# Patient Record
Sex: Male | Born: 1955 | Race: White | Hispanic: No | Marital: Married
Health system: Southern US, Community
[De-identification: ages and names within clinical notes are randomized; demographics above are authoritative.]

---

## 2017-07-11 ENCOUNTER — Inpatient Hospital Stay
Admission: AD | Admit: 2017-07-11 | Discharge: 2017-08-05 | Disposition: A | Discharge status: Home / Self Care | Payer: Self-pay | Source: Other Acute Inpatient Hospital | Attending: Internal Medicine | Admitting: Internal Medicine

## 2017-07-11 DIAGNOSIS — T85520A Displacement of bile duct prosthesis, initial encounter: Secondary | ICD-10-CM

## 2017-07-11 DIAGNOSIS — L0291 Cutaneous abscess, unspecified: Secondary | ICD-10-CM

## 2017-07-12 LAB — CBC WITH DIFFERENTIAL/PLATELET
BASOS ABS: 0 10*3/uL (ref 0.0–0.1)
BASOS PCT: 1 %
EOS PCT: 6 %
Eosinophils Absolute: 0.2 10*3/uL (ref 0.0–0.7)
HEMATOCRIT: 28.1 % — AB (ref 39.0–52.0)
HEMOGLOBIN: 8.9 g/dL — AB (ref 13.0–17.0)
Lymphocytes Relative: 26 %
Lymphs Abs: 1 10*3/uL (ref 0.7–4.0)
MCH: 28 pg (ref 26.0–34.0)
MCHC: 31.7 g/dL (ref 30.0–36.0)
MCV: 88.4 fL (ref 78.0–100.0)
Monocytes Absolute: 0.4 10*3/uL (ref 0.1–1.0)
Monocytes Relative: 11 %
Neutro Abs: 2.2 10*3/uL (ref 1.7–7.7)
Neutrophils Relative %: 56 %
Platelets: 198 10*3/uL (ref 150–400)
RBC: 3.18 MIL/uL — AB (ref 4.22–5.81)
RDW: 19.3 % — AB (ref 11.5–15.5)
WBC: 3.9 10*3/uL — AB (ref 4.0–10.5)

## 2017-07-12 LAB — COMPREHENSIVE METABOLIC PANEL
ALBUMIN: 1.1 g/dL — AB (ref 3.5–5.0)
ALT: 38 U/L (ref 17–63)
AST: 37 U/L (ref 15–41)
Alkaline Phosphatase: 214 U/L — ABNORMAL HIGH (ref 38–126)
Anion gap: 3 — ABNORMAL LOW (ref 5–15)
BILIRUBIN TOTAL: 0.3 mg/dL (ref 0.3–1.2)
BUN: 7 mg/dL (ref 6–20)
CHLORIDE: 101 mmol/L (ref 101–111)
CO2: 27 mmol/L (ref 22–32)
CREATININE: 0.35 mg/dL — AB (ref 0.61–1.24)
Calcium: 7.8 mg/dL — ABNORMAL LOW (ref 8.9–10.3)
GFR calc Af Amer: >60 mL/min (ref >60)
GFR calc non Af Amer: >60 mL/min (ref >60)
GLUCOSE: 73 mg/dL (ref 65–99)
POTASSIUM: 3.5 mmol/L (ref 3.5–5.1)
Sodium: 131 mmol/L — ABNORMAL LOW (ref 135–145)
TOTAL PROTEIN: 5.7 g/dL — AB (ref 6.5–8.1)

## 2017-07-13 LAB — CBC WITH DIFFERENTIAL/PLATELET
BASOS ABS: 0 10*3/uL (ref 0.0–0.1)
Basophils Relative: 1 %
EOS PCT: 7 %
Eosinophils Absolute: 0.3 10*3/uL (ref 0.0–0.7)
HEMATOCRIT: 26.3 % — AB (ref 39.0–52.0)
Hemoglobin: 8.4 g/dL — ABNORMAL LOW (ref 13.0–17.0)
LYMPHS PCT: 22 %
Lymphs Abs: 0.9 10*3/uL (ref 0.7–4.0)
MCH: 28.5 pg (ref 26.0–34.0)
MCHC: 31.9 g/dL (ref 30.0–36.0)
MCV: 89.2 fL (ref 78.0–100.0)
Monocytes Absolute: 0.4 10*3/uL (ref 0.1–1.0)
Monocytes Relative: 10 %
NEUTROS ABS: 2.4 10*3/uL (ref 1.7–7.7)
Neutrophils Relative %: 60 %
PLATELETS: 248 10*3/uL (ref 150–400)
RBC: 2.95 MIL/uL — ABNORMAL LOW (ref 4.22–5.81)
RDW: 19.5 % — ABNORMAL HIGH (ref 11.5–15.5)
WBC: 4 10*3/uL (ref 4.0–10.5)

## 2017-07-13 LAB — COMPREHENSIVE METABOLIC PANEL
ALK PHOS: 249 U/L — AB (ref 38–126)
ALT: 62 U/L (ref 17–63)
ANION GAP: 3 — AB (ref 5–15)
AST: 64 U/L — AB (ref 15–41)
Albumin: 1 g/dL — ABNORMAL LOW (ref 3.5–5.0)
BILIRUBIN TOTAL: 0.5 mg/dL (ref 0.3–1.2)
BUN: 8 mg/dL (ref 6–20)
CALCIUM: 7.4 mg/dL — AB (ref 8.9–10.3)
CO2: 26 mmol/L (ref 22–32)
Chloride: 100 mmol/L — ABNORMAL LOW (ref 101–111)
Creatinine, Ser: 0.3 mg/dL — ABNORMAL LOW (ref 0.61–1.24)
GFR calc non Af Amer: >60 mL/min (ref >60)
GLUCOSE: 159 mg/dL — AB (ref 65–99)
POTASSIUM: 3.6 mmol/L (ref 3.5–5.1)
Sodium: 129 mmol/L — ABNORMAL LOW (ref 135–145)
TOTAL PROTEIN: 5.7 g/dL — AB (ref 6.5–8.1)

## 2017-07-14 LAB — BASIC METABOLIC PANEL
ANION GAP: 5 (ref 5–15)
BUN: 9 mg/dL (ref 6–20)
CALCIUM: 7.7 mg/dL — AB (ref 8.9–10.3)
CO2: 25 mmol/L (ref 22–32)
Chloride: 101 mmol/L (ref 101–111)
Creatinine, Ser: <0.3 mg/dL — ABNORMAL LOW (ref 0.61–1.24)
GLUCOSE: 163 mg/dL — AB (ref 65–99)
POTASSIUM: 3.8 mmol/L (ref 3.5–5.1)
Sodium: 131 mmol/L — ABNORMAL LOW (ref 135–145)

## 2017-07-16 LAB — BASIC METABOLIC PANEL
ANION GAP: 6 (ref 5–15)
BUN: 10 mg/dL (ref 6–20)
CALCIUM: 8 mg/dL — AB (ref 8.9–10.3)
CO2: 24 mmol/L (ref 22–32)
CREATININE: 0.32 mg/dL — AB (ref 0.61–1.24)
Chloride: 101 mmol/L (ref 101–111)
GFR calc Af Amer: >60 mL/min (ref >60)
Glucose, Bld: 86 mg/dL (ref 65–99)
Potassium: 4 mmol/L (ref 3.5–5.1)
Sodium: 131 mmol/L — ABNORMAL LOW (ref 135–145)

## 2017-07-16 LAB — CBC
HCT: 25.1 % — ABNORMAL LOW (ref 39.0–52.0)
Hemoglobin: 8.2 g/dL — ABNORMAL LOW (ref 13.0–17.0)
MCH: 29.5 pg (ref 26.0–34.0)
MCHC: 32.7 g/dL (ref 30.0–36.0)
MCV: 90.3 fL (ref 78.0–100.0)
PLATELETS: 271 10*3/uL (ref 150–400)
RBC: 2.78 MIL/uL — ABNORMAL LOW (ref 4.22–5.81)
RDW: 20.2 % — AB (ref 11.5–15.5)
WBC: 5.8 10*3/uL (ref 4.0–10.5)

## 2017-07-16 LAB — CK: CK TOTAL: 16 U/L — AB (ref 49–397)

## 2017-07-20 LAB — CBC WITH DIFFERENTIAL/PLATELET
BASOS ABS: 0 10*3/uL (ref 0.0–0.1)
Basophils Relative: 0 %
EOS PCT: 4 %
Eosinophils Absolute: 0.2 10*3/uL (ref 0.0–0.7)
HEMATOCRIT: 25.3 % — AB (ref 39.0–52.0)
HEMOGLOBIN: 8.2 g/dL — AB (ref 13.0–17.0)
LYMPHS ABS: 1.1 10*3/uL (ref 0.7–4.0)
Lymphocytes Relative: 19 %
MCH: 29.9 pg (ref 26.0–34.0)
MCHC: 32.4 g/dL (ref 30.0–36.0)
MCV: 92.3 fL (ref 78.0–100.0)
Monocytes Absolute: 0.9 10*3/uL (ref 0.1–1.0)
Monocytes Relative: 16 %
NEUTROS ABS: 3.3 10*3/uL (ref 1.7–7.7)
NEUTROS PCT: 61 %
Platelets: 304 10*3/uL (ref 150–400)
RBC: 2.74 MIL/uL — AB (ref 4.22–5.81)
RDW: 20.1 % — AB (ref 11.5–15.5)
WBC: 5.5 10*3/uL (ref 4.0–10.5)

## 2017-07-20 LAB — RENAL FUNCTION PANEL
ANION GAP: 5 (ref 5–15)
Albumin: 1.4 g/dL — ABNORMAL LOW (ref 3.5–5.0)
BUN: 12 mg/dL (ref 6–20)
CHLORIDE: 100 mmol/L — AB (ref 101–111)
CO2: 25 mmol/L (ref 22–32)
Calcium: 8.3 mg/dL — ABNORMAL LOW (ref 8.9–10.3)
Creatinine, Ser: 0.35 mg/dL — ABNORMAL LOW (ref 0.61–1.24)
GFR calc Af Amer: >60 mL/min (ref >60)
GFR calc non Af Amer: >60 mL/min (ref >60)
Glucose, Bld: 144 mg/dL — ABNORMAL HIGH (ref 65–99)
POTASSIUM: 3.6 mmol/L (ref 3.5–5.1)
Phosphorus: 3.9 mg/dL (ref 2.5–4.6)
Sodium: 130 mmol/L — ABNORMAL LOW (ref 135–145)

## 2017-07-20 LAB — MAGNESIUM: MAGNESIUM: 1.7 mg/dL (ref 1.7–2.4)

## 2017-07-21 ENCOUNTER — Other Ambulatory Visit (HOSPITAL_COMMUNITY): Payer: Self-pay

## 2017-07-21 LAB — URINALYSIS, ROUTINE W REFLEX MICROSCOPIC
BACTERIA UA: NONE SEEN
BILIRUBIN URINE: NEGATIVE
Glucose, UA: 50 mg/dL — AB
HGB URINE DIPSTICK: NEGATIVE
Ketones, ur: NEGATIVE mg/dL
LEUKOCYTES UA: NEGATIVE
NITRITE: NEGATIVE
PROTEIN: 30 mg/dL — AB
Specific Gravity, Urine: 1.028 (ref 1.005–1.030)
Squamous Epithelial / LPF: NONE SEEN
pH: 6 (ref 5.0–8.0)

## 2017-07-21 MED ORDER — IOPAMIDOL (ISOVUE-300) INJECTION 61%
INTRAVENOUS | Status: AC
  Administered 2017-07-21: 100 mL
  Filled 2017-07-21: qty 100

## 2017-07-21 MED ORDER — IOPAMIDOL (ISOVUE-300) INJECTION 61%
INTRAVENOUS | Status: AC
  Filled 2017-07-21: qty 30

## 2017-07-22 ENCOUNTER — Encounter (HOSPITAL_COMMUNITY): Payer: Self-pay | Admitting: Interventional Radiology

## 2017-07-22 ENCOUNTER — Other Ambulatory Visit (HOSPITAL_COMMUNITY): Payer: Self-pay

## 2017-07-22 HISTORY — PX: IR CATHETER TUBE CHANGE: IMG717

## 2017-07-22 LAB — URINE CULTURE: Culture: NO GROWTH

## 2017-07-22 MED ORDER — LIDOCAINE HCL (PF) 2 % IJ SOLN
INTRAMUSCULAR | Status: AC
  Filled 2017-07-22: qty 10

## 2017-07-22 MED ORDER — IOPAMIDOL (ISOVUE-300) INJECTION 61%
INTRAVENOUS | Status: AC
  Administered 2017-07-22: 15 mL
  Filled 2017-07-22: qty 50

## 2017-07-22 MED ORDER — LIDOCAINE HCL (PF) 1 % IJ SOLN
INTRAMUSCULAR | Status: DC | PRN
  Administered 2017-07-22: 10 mL

## 2017-07-22 NOTE — Procedures (Signed)
Interventional Radiology Procedure Note  Procedure: Exchange of right retroperitoneal abscess drains x 2  Complications: None  Estimated Blood Loss: None  Based on poor output and residual abscess by CT, superior 12 Fr and more inferior 14 Fr perc drains exchanged and repositioned such that superior drain further in medial/superior aspect of abscess and inferior drain in inferior aspect of residual abscess.  Both now pouring out purulent fluid.  Both drains attached to suction bulbs.  Jodi Marble. Fredia Sorrow, M.D Pager:  [phone]

## 2017-07-22 NOTE — Progress Notes (Signed)
Patient ID: Chase Barnett, male   DOB: January 16, 1956, 61 y.o.   MRN: [id number]    Referring Physician(s): Dr. Carron Curie  Supervising Physician: Irish Lack  Patient Status: [facility]- inpt  Chief Complaint: Retroperitoneal abscesses  Subjective: The patient is currently in [facility] secondary to a complex abdominal history.  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  He has undergone HIPEC in 2016.  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  He has since developed these retroperitoneal abscesses that were drained by IR there.  He has since been discharged to [facility] and a repeat CT scan was completed that reveals persistent fluid collections despite these two drains.  His drains are not being flushed in [facility] right now.  We have been asked to evaluate these drains for further recommendations.  Allergies: Patient has no allergy information on record.  Medications: Prior to Admission medications   Not on File    Vital Signs: Reviewed in paper chart  Physical Exam: Abd: soft, ostomy in place, 2 drains in right flank area.  Both drains were flushed with NS.  One drain did not evacuate much after being flushed, but the more inferior drain began draining quite well after being flushed.  Output is tan, purulent, thick.  Imaging: Ct Abdomen Pelvis W Contrast  Result Date: 07/21/2017 CLINICAL DATA:  Upper right abdominal pain for weeks. Nausea and vomiting. History of exploratory laparotomy early September. No clinical improvement. EXAM: CT ABDOMEN AND PELVIS WITH CONTRAST TECHNIQUE: Multidetector CT imaging of the abdomen and pelvis was performed using the standard protocol following bolus administration of intravenous contrast. CONTRAST:  ISOVUE-300 IOPAMIDOL (ISOVUE-300) INJECTION 61% COMPARISON:  None. FINDINGS: Lower chest: Small bilateral pleural  effusions with atelectasis. Coronary atherosclerosis. Gastroesophageal reflux. Hepatobiliary: No focal liver abnormality.Collapsed gallbladder may be present. No calcified stone or ductal dilatation. Pancreas: Unremarkable. Spleen: Rounded fatty density along the anterior capsule measuring 19 mm, simple appearing Adrenals/Urinary Tract: Negative adrenals. Right posterior pararenal rim enhancing fluid collection measuring 7 x 3 x 11 cm. There is tracking inferiorly into the right iliopsoas, to the level of the groin. Two percutaneous catheters are in place and and broad continuity the collection. Mild right hydronephrosis. No hydroureter. Cystic density in the ventral right kidney measuring 3 cm, with imperceptible walls suggesting cyst rather than abscess. There are small bilateral renal calculi measuring 3 mm or less. Stomach/Bowel: Extensive bowel surgery. Evidence of a right hemicolectomy and there is also enteroenteric anastomosis in the right lower quadrant. There appears to be a distal transverse double lumen colostomy. The associated bag does not contain contrast. There also appears to be a midline bag which does contain oral contrast, presumably covering a enterocutaneous fistula. There are some dilated loops of small bowel without high-grade obstruction. Colon is decompressed. Vascular/Lymphatic: Advanced atherosclerosis of the aorta and branch vessels. Distal aorta is 25 mm diameter. Reproductive:No acute finding. Other: No ascites or pneumoperitoneum. Musculoskeletal: L3 right transverse process fracture. Left hip arthroplasty. No evidence of osseous infection. IMPRESSION: 1. Large right posterior pararenal collection/abscess, 7 x 3 x 11 cm, despite 2 drains within the collection. Please correlate for catheter output. The collection tracks io the inflamed right iliopsoas to the level of the inguinal ligament. Primary source of this collection is uncertain, it could reflect a complicated hematoma or  primary abscess. There is postoperative changes or scarring to the upper  pole right kidney. 2. Postoperative bowel. Oral contrast does not reach a distal transverse colostomy and is likely preferentially draining through an enterocutaneous fistula in the midline. Negative for bowel obstruction. Oral contrast does not fill the retroperitoneal collection 3. Thick walled bladder with nonspecific calcific density near the right ureteral orifice. Small volume pneumaturia. Please correlate with urinalysis. 4. Mild right hydronephrosis. 5. Small bilateral pleural effusions multi segment atelectasis. 6. L3 right transverse process fracture. 7. Advanced atherosclerosis. 8. Bilateral nephrolithiasis. Electronically Signed   By: Marnee Spring M.D.   On: 07/21/2017 08:02    Labs:  CBC:  Recent Labs  07/12/17 1620 07/13/17 0610 07/16/17 0720 07/20/17 1101  WBC 3.9* 4.0 5.8 5.5  HGB 8.9* 8.4* 8.2* 8.2*  HCT 28.1* 26.3* 25.1* 25.3*  PLT 198 248 271 304    COAGS: No results for input(s): INR, APTT in the last 8760 hours.  BMP:  Recent Labs  07/13/17 0610 07/14/17 0631 07/16/17 0720 07/20/17 1102  NA 129* 131* 131* 130*  K 3.6 3.8 4.0 3.6  CL 100* 101 101 100*  CO2 GLUCOSE 159* 163* 86 144*  BUN CALCIUM 7.4* 7.7* 8.0* 8.3*  CREATININE 0.30* <0.30* 0.32* 0.35*  GFRNONAA >60 NOT CALCULATED >60 >60  GFRAA >60 NOT CALCULATED >60 >60    LIVER FUNCTION TESTS:  Recent Labs  07/12/17 1620 07/13/17 0610 07/20/17 1102  BILITOT 0.3 0.5  --   AST 37 64*  --   ALT 38 62  --   ALKPHOS 214* 249*  --   PROT 5.7* 5.7*  --   ALBUMIN 1.1* 1.0* 1.4*    Assessment and Plan: 1. Appendiceal carcinomatosis, retroperitoneal abscesses  Dr. Fredia Sorrow has reviewed the imaging and feels that the drains will need to be repositioned/exchanged to have them in a better location to completely drain these collections.  I have also written an order for TID flushing of each drain with  5CC of normal saline.  The output should be documented q shift as well.  This was all explained to the patient and his wife who was present.  They agree with this procedure and plan.  Consent has been signed.  Electronically Signed: Letha Cape 07/22/2017, 12:38 PM   I spent a total of 25 Minutes at the the patient's bedside AND on the patient's hospital floor or unit, greater than 50% of which was counseling/coordinating care for retroperitoneal abscesses

## 2017-07-23 NOTE — Progress Notes (Signed)
Referring Physician(s): Hijazi  Supervising Physician: Irish Lack  Patient Status:  [facility]  Chief Complaint:  Appendiceal Carcinoma - peritoneal carcinomatosis   Subjective:  Retroperitoneal abscesses Came to [facility] with drains in place Drain exchanges 9/21 in IR Output is great  Allergies: Patient has no allergy information on record.  Medications: Prior to Admission medications   Not on File     Vital Signs: There were no vitals taken for this visit.  Physical Exam  Abdominal: Soft. There is tenderness.  Neurological: He is alert.  Skin: Skin is warm and dry.  Sites are clean and dry Tender to touch OP serosanguinous B 100-200 each    Nursing note and vitals reviewed.   Imaging: Ct Abdomen Pelvis W Contrast  Result Date: 07/21/2017 CLINICAL DATA:  Upper right abdominal pain for weeks. Nausea and vomiting. History of exploratory laparotomy early September. No clinical improvement. EXAM: CT ABDOMEN AND PELVIS WITH CONTRAST TECHNIQUE: Multidetector CT imaging of the abdomen and pelvis was performed using the standard protocol following bolus administration of intravenous contrast. CONTRAST:  ISOVUE-300 IOPAMIDOL (ISOVUE-300) INJECTION 61% COMPARISON:  None. FINDINGS: Lower chest: Small bilateral pleural effusions with atelectasis. Coronary atherosclerosis. Gastroesophageal reflux. Hepatobiliary: No focal liver abnormality.Collapsed gallbladder may be present. No calcified stone or ductal dilatation. Pancreas: Unremarkable. Spleen: Rounded fatty density along the anterior capsule measuring 19 mm, simple appearing Adrenals/Urinary Tract: Negative adrenals. Right posterior pararenal rim enhancing fluid collection measuring 7 x 3 x 11 cm. There is tracking inferiorly into the right iliopsoas, to the level of the groin. Two percutaneous catheters are in place and and broad continuity the collection. Mild right hydronephrosis. No hydroureter. Cystic  density in the ventral right kidney measuring 3 cm, with imperceptible walls suggesting cyst rather than abscess. There are small bilateral renal calculi measuring 3 mm or less. Stomach/Bowel: Extensive bowel surgery. Evidence of a right hemicolectomy and there is also enteroenteric anastomosis in the right lower quadrant. There appears to be a distal transverse double lumen colostomy. The associated bag does not contain contrast. There also appears to be a midline bag which does contain oral contrast, presumably covering a enterocutaneous fistula. There are some dilated loops of small bowel without high-grade obstruction. Colon is decompressed. Vascular/Lymphatic: Advanced atherosclerosis of the aorta and branch vessels. Distal aorta is 25 mm diameter. Reproductive:No acute finding. Other: No ascites or pneumoperitoneum. Musculoskeletal: L3 right transverse process fracture. Left hip arthroplasty. No evidence of osseous infection. IMPRESSION: 1. Large right posterior pararenal collection/abscess, 7 x 3 x 11 cm, despite 2 drains within the collection. Please correlate for catheter output. The collection tracks io the inflamed right iliopsoas to the level of the inguinal ligament. Primary source of this collection is uncertain, it could reflect a complicated hematoma or primary abscess. There is postoperative changes or scarring to the upper pole right kidney. 2. Postoperative bowel. Oral contrast does not reach a distal transverse colostomy and is likely preferentially draining through an enterocutaneous fistula in the midline. Negative for bowel obstruction. Oral contrast does not fill the retroperitoneal collection 3. Thick walled bladder with nonspecific calcific density near the right ureteral orifice. Small volume pneumaturia. Please correlate with urinalysis. 4. Mild right hydronephrosis. 5. Small bilateral pleural effusions multi segment atelectasis. 6. L3 right transverse process fracture. 7. Advanced  atherosclerosis. 8. Bilateral nephrolithiasis. Electronically Signed   By: Marnee Spring M.D.   On: 07/21/2017 08:02   Ir Catheter Tube Change  Result Date: 07/22/2017 INDICATION: Large right retroperitoneal perinephric  abscess with current indwelling superior and inferior drainage catheters placed at outside institution. The catheters are not draining well and there remains a fairly large abscess by CT. Drain manipulation and exchange now performed to optimize drainage of the abscess. EXAM: IR CATHETER TUBE CHANGE X 2 MEDICATIONS: The patient is currently admitted to the hospital and receiving intravenous antibiotics. The antibiotics were administered within an appropriate time frame prior to the initiation of the procedure. ANESTHESIA/SEDATION: None FLUOROSCOPY TIME:  1 minutes and 30 seconds.  3.0 mGy. CONTRAST:  20 mL Isovue-300 COMPLICATIONS: None immediate. PROCEDURE: Informed written consent was obtained from the patient after a thorough discussion of the procedural risks, benefits and alternatives. All questions were addressed. Maximal Sterile Barrier Technique was utilized including caps, mask, sterile gowns, sterile gloves, sterile drape, hand hygiene and skin antiseptic. A timeout was performed prior to the initiation of the procedure. Initial fluoroscopy was performed of pre-existing drainage catheters. A more superiorly positioned 12 French percutaneous drain was initially injected with contrast material. The catheter was then cut and removed over a guidewire. A new 12 French drain was then advanced over the wire and positioned in the superior aspect of the residual abscess cavity. A more inferiorly positioned 14 French percutaneous drain was injected with contrast. This drain was then cut and removed over a guidewire. A new 14 French drain was then advanced over the wire and positioned in the inferior aspect of the residual abscess cavity. Both drains were connected to new suction bulbs. 1%  lidocaine was used for local anesthetic at the drain exit sites. Prolene retention sutures were applied at both drain exit sites. FINDINGS: The superior pre-existing 8 French drainage catheter is barely within the lateral aspect of the retroperitoneal abscess. After catheter exchange, a new 12 French catheter was directed into a more medial position in the upper abscess cavity. The inferior pre-existing 14 French drainage catheter enters the abscess cavity and extends all the way up into the superior aspect. Based on residual fluid in the abscess cavity which is primarily along the inferior margin of the right kidney, the new 14 French drain was re- directed into the inferior aspect of the abscess cavity. After repositioning, both drains now demonstrate excellent flow of purulent fluid. IMPRESSION: Exchange and repositioning of 2 separate percutaneous drainage catheters within a large residual right-sided retroperitoneal abscess. Pre-existing 12 [place] and 14 French drains were exchanged and placed into better positions to optimize drainage of the residual abscess. Electronically Signed   By: Irish Lack M.D.   On: 07/22/2017 15:52   Ir Catheter Tube Change  Result Date: 07/22/2017 INDICATION: Large right retroperitoneal perinephric abscess with current indwelling superior and inferior drainage catheters placed at outside institution. The catheters are not draining well and there remains a fairly large abscess by CT. Drain manipulation and exchange now performed to optimize drainage of the abscess. EXAM: IR CATHETER TUBE CHANGE X 2 MEDICATIONS: The patient is currently admitted to the hospital and receiving intravenous antibiotics. The antibiotics were administered within an appropriate time frame prior to the initiation of the procedure. ANESTHESIA/SEDATION: None FLUOROSCOPY TIME:  1 minutes and 30 seconds.  3.0 mGy. CONTRAST:  20 mL Isovue-300 COMPLICATIONS: None immediate. PROCEDURE: Informed written  consent was obtained from the patient after a thorough discussion of the procedural risks, benefits and alternatives. All questions were addressed. Maximal Sterile Barrier Technique was utilized including caps, mask, sterile gowns, sterile gloves, sterile drape, hand hygiene and skin antiseptic. A timeout was performed prior  to the initiation of the procedure. Initial fluoroscopy was performed of pre-existing drainage catheters. A more superiorly positioned 12 French percutaneous drain was initially injected with contrast material. The catheter was then cut and removed over a guidewire. A new 12 French drain was then advanced over the wire and positioned in the superior aspect of the residual abscess cavity. A more inferiorly positioned 14 French percutaneous drain was injected with contrast. This drain was then cut and removed over a guidewire. A new 14 French drain was then advanced over the wire and positioned in the inferior aspect of the residual abscess cavity. Both drains were connected to new suction bulbs. 1% lidocaine was used for local anesthetic at the drain exit sites. Prolene retention sutures were applied at both drain exit sites. FINDINGS: The superior pre-existing 40 French drainage catheter is barely within the lateral aspect of the retroperitoneal abscess. After catheter exchange, a new 12 French catheter was directed into a more medial position in the upper abscess cavity. The inferior pre-existing 14 French drainage catheter enters the abscess cavity and extends all the way up into the superior aspect. Based on residual fluid in the abscess cavity which is primarily along the inferior margin of the right kidney, the new 14 French drain was re- directed into the inferior aspect of the abscess cavity. After repositioning, both drains now demonstrate excellent flow of purulent fluid. IMPRESSION: Exchange and repositioning of 2 separate percutaneous drainage catheters within a large residual  right-sided retroperitoneal abscess. Pre-existing 12 [place] and 14 French drains were exchanged and placed into better positions to optimize drainage of the residual abscess. Electronically Signed   By: Irish Lack M.D.   On: 07/22/2017 15:52    Labs:  CBC:  Recent Labs  07/12/17 1620 07/13/17 0610 07/16/17 0720 07/20/17 1101  WBC 3.9* 4.0 5.8 5.5  HGB 8.9* 8.4* 8.2* 8.2*  HCT 28.1* 26.3* 25.1* 25.3*  PLT 198 248 271 304    COAGS: No results for input(s): INR, APTT in the last 8760 hours.  BMP:  Recent Labs  07/13/17 0610 07/14/17 0631 07/16/17 0720 07/20/17 1102  NA 129* 131* 131* 130*  K 3.6 3.8 4.0 3.6  CL 100* 101 101 100*  CO2 GLUCOSE 159* 163* 86 144*  BUN CALCIUM 7.4* 7.7* 8.0* 8.3*  CREATININE 0.30* <0.30* 0.32* 0.35*  GFRNONAA >60 NOT CALCULATED >60 >60  GFRAA >60 NOT CALCULATED >60 >60    LIVER FUNCTION TESTS:  Recent Labs  07/12/17 1620 07/13/17 0610 07/20/17 1102  BILITOT 0.3 0.5  --   AST 37 64*  --   ALT 38 62  --   ALKPHOS 214* 249*  --   PROT 5.7* 5.7*  --   ALBUMIN 1.1* 1.0* 1.4*    Assessment and Plan:  Drain exchanges Draining well Will follow  Electronically Signed: Oluwasemilore Bahl A, PA-C 07/23/2017, 11:44 AM   I spent a total of 15 Minutes at the the patient's bedside AND on the patient's hospital floor or unit, greater than 50% of which was counseling/coordinating care for abd abscess drains

## 2017-07-25 LAB — CBC
HEMATOCRIT: 25.2 % — AB (ref 39.0–52.0)
HEMOGLOBIN: 8 g/dL — AB (ref 13.0–17.0)
MCH: 29.1 pg (ref 26.0–34.0)
MCHC: 31.7 g/dL (ref 30.0–36.0)
MCV: 91.6 fL (ref 78.0–100.0)
Platelets: 325 10*3/uL (ref 150–400)
RBC: 2.75 MIL/uL — AB (ref 4.22–5.81)
RDW: 18.6 % — ABNORMAL HIGH (ref 11.5–15.5)
WBC: 4.7 10*3/uL (ref 4.0–10.5)

## 2017-07-25 LAB — COMPREHENSIVE METABOLIC PANEL
ALK PHOS: 350 U/L — AB (ref 38–126)
ALT: 80 U/L — AB (ref 17–63)
ANION GAP: 3 — AB (ref 5–15)
AST: 67 U/L — AB (ref 15–41)
Albumin: 1.4 g/dL — ABNORMAL LOW (ref 3.5–5.0)
BUN: 9 mg/dL (ref 6–20)
CO2: 30 mmol/L (ref 22–32)
Calcium: 8.2 mg/dL — ABNORMAL LOW (ref 8.9–10.3)
Chloride: 100 mmol/L — ABNORMAL LOW (ref 101–111)
Creatinine, Ser: 0.31 mg/dL — ABNORMAL LOW (ref 0.61–1.24)
GFR calc Af Amer: >60 mL/min (ref >60)
GFR calc non Af Amer: >60 mL/min (ref >60)
GLUCOSE: 146 mg/dL — AB (ref 65–99)
POTASSIUM: 3.3 mmol/L — AB (ref 3.5–5.1)
Sodium: 133 mmol/L — ABNORMAL LOW (ref 135–145)
TOTAL PROTEIN: 6.2 g/dL — AB (ref 6.5–8.1)
Total Bilirubin: 0.3 mg/dL (ref 0.3–1.2)

## 2017-07-25 LAB — PHOSPHORUS: Phosphorus: 3.5 mg/dL (ref 2.5–4.6)

## 2017-07-25 LAB — MAGNESIUM: Magnesium: 1.7 mg/dL (ref 1.7–2.4)

## 2017-07-26 LAB — LIPID PANEL
CHOL/HDL RATIO: 4.3 ratio
CHOLESTEROL: 68 mg/dL (ref 0–200)
HDL: 16 mg/dL — AB (ref >40)
LDL Cholesterol: 40 mg/dL (ref 0–99)
TRIGLYCERIDES: 59 mg/dL (ref <150)
VLDL: 12 mg/dL (ref 0–40)

## 2017-07-26 LAB — BASIC METABOLIC PANEL
Anion gap: 4 — ABNORMAL LOW (ref 5–15)
BUN: 9 mg/dL (ref 6–20)
CHLORIDE: 101 mmol/L (ref 101–111)
CO2: 29 mmol/L (ref 22–32)
Calcium: 8.1 mg/dL — ABNORMAL LOW (ref 8.9–10.3)
Creatinine, Ser: 0.36 mg/dL — ABNORMAL LOW (ref 0.61–1.24)
GFR calc Af Amer: >60 mL/min (ref >60)
GFR calc non Af Amer: >60 mL/min (ref >60)
GLUCOSE: 59 mg/dL — AB (ref 65–99)
Potassium: 3.3 mmol/L — ABNORMAL LOW (ref 3.5–5.1)
SODIUM: 134 mmol/L — AB (ref 135–145)

## 2017-07-26 LAB — OSMOLALITY: Osmolality: 284 mOsm/kg (ref 275–295)

## 2017-07-26 NOTE — Progress Notes (Signed)
Referring Physician(s): Hijazi  Supervising Physician: Irish Lack  Patient Status:  [facility]  Chief Complaint:  Appendiceal Carcinoma - peritoneal carcinomatosis  Subjective:  Retroperitoneal abscesses Came to [facility] with drains in place Drain exchanges 9/21 in IR Output is significant Pt feels so much better   Allergies: Patient has no allergy information on record.  Medications: Prior to Admission medications   Not on File     Vital Signs: There were no vitals taken for this visit.  Physical Exam  Abdominal: Soft.  Neurological: He is alert.  Skin: Skin is warm and dry.  Sites are clean and dry NT no bleeding Flushes easily (both) OP 30-40 daily for both   Nursing note and vitals reviewed.   Imaging: Ir Catheter Tube Change  Result Date: 07/22/2017 INDICATION: Large right retroperitoneal perinephric abscess with current indwelling superior and inferior drainage catheters placed at outside institution. The catheters are not draining well and there remains a fairly large abscess by CT. Drain manipulation and exchange now performed to optimize drainage of the abscess. EXAM: IR CATHETER TUBE CHANGE X 2 MEDICATIONS: The patient is currently admitted to the hospital and receiving intravenous antibiotics. The antibiotics were administered within an appropriate time frame prior to the initiation of the procedure. ANESTHESIA/SEDATION: None FLUOROSCOPY TIME:  1 minutes and 30 seconds.  3.0 mGy. CONTRAST:  20 mL Isovue-300 COMPLICATIONS: None immediate. PROCEDURE: Informed written consent was obtained from the patient after a thorough discussion of the procedural risks, benefits and alternatives. All questions were addressed. Maximal Sterile Barrier Technique was utilized including caps, mask, sterile gowns, sterile gloves, sterile drape, hand hygiene and skin antiseptic. A timeout was performed prior to the initiation of the procedure. Initial fluoroscopy was  performed of pre-existing drainage catheters. A more superiorly positioned 12 French percutaneous drain was initially injected with contrast material. The catheter was then cut and removed over a guidewire. A new 12 French drain was then advanced over the wire and positioned in the superior aspect of the residual abscess cavity. A more inferiorly positioned 14 French percutaneous drain was injected with contrast. This drain was then cut and removed over a guidewire. A new 14 French drain was then advanced over the wire and positioned in the inferior aspect of the residual abscess cavity. Both drains were connected to new suction bulbs. 1% lidocaine was used for local anesthetic at the drain exit sites. Prolene retention sutures were applied at both drain exit sites. FINDINGS: The superior pre-existing 82 French drainage catheter is barely within the lateral aspect of the retroperitoneal abscess. After catheter exchange, a new 12 French catheter was directed into a more medial position in the upper abscess cavity. The inferior pre-existing 14 French drainage catheter enters the abscess cavity and extends all the way up into the superior aspect. Based on residual fluid in the abscess cavity which is primarily along the inferior margin of the right kidney, the new 14 French drain was re- directed into the inferior aspect of the abscess cavity. After repositioning, both drains now demonstrate excellent flow of purulent fluid. IMPRESSION: Exchange and repositioning of 2 separate percutaneous drainage catheters within a large residual right-sided retroperitoneal abscess. Pre-existing 12 [place] and 14 French drains were exchanged and placed into better positions to optimize drainage of the residual abscess. Electronically Signed   By: Irish Lack M.D.   On: 07/22/2017 15:52   Ir Catheter Tube Change  Result Date: 07/22/2017 INDICATION: Large right retroperitoneal perinephric abscess with current indwelling  superior  and inferior drainage catheters placed at outside institution. The catheters are not draining well and there remains a fairly large abscess by CT. Drain manipulation and exchange now performed to optimize drainage of the abscess. EXAM: IR CATHETER TUBE CHANGE X 2 MEDICATIONS: The patient is currently admitted to the hospital and receiving intravenous antibiotics. The antibiotics were administered within an appropriate time frame prior to the initiation of the procedure. ANESTHESIA/SEDATION: None FLUOROSCOPY TIME:  1 minutes and 30 seconds.  3.0 mGy. CONTRAST:  20 mL Isovue-300 COMPLICATIONS: None immediate. PROCEDURE: Informed written consent was obtained from the patient after a thorough discussion of the procedural risks, benefits and alternatives. All questions were addressed. Maximal Sterile Barrier Technique was utilized including caps, mask, sterile gowns, sterile gloves, sterile drape, hand hygiene and skin antiseptic. A timeout was performed prior to the initiation of the procedure. Initial fluoroscopy was performed of pre-existing drainage catheters. A more superiorly positioned 12 French percutaneous drain was initially injected with contrast material. The catheter was then cut and removed over a guidewire. A new 12 French drain was then advanced over the wire and positioned in the superior aspect of the residual abscess cavity. A more inferiorly positioned 14 French percutaneous drain was injected with contrast. This drain was then cut and removed over a guidewire. A new 14 French drain was then advanced over the wire and positioned in the inferior aspect of the residual abscess cavity. Both drains were connected to new suction bulbs. 1% lidocaine was used for local anesthetic at the drain exit sites. Prolene retention sutures were applied at both drain exit sites. FINDINGS: The superior pre-existing 29 French drainage catheter is barely within the lateral aspect of the retroperitoneal abscess. After  catheter exchange, a new 12 French catheter was directed into a more medial position in the upper abscess cavity. The inferior pre-existing 14 French drainage catheter enters the abscess cavity and extends all the way up into the superior aspect. Based on residual fluid in the abscess cavity which is primarily along the inferior margin of the right kidney, the new 14 French drain was re- directed into the inferior aspect of the abscess cavity. After repositioning, both drains now demonstrate excellent flow of purulent fluid. IMPRESSION: Exchange and repositioning of 2 separate percutaneous drainage catheters within a large residual right-sided retroperitoneal abscess. Pre-existing 12 [place] and 14 French drains were exchanged and placed into better positions to optimize drainage of the residual abscess. Electronically Signed   By: Irish Lack M.D.   On: 07/22/2017 15:52    Labs:  CBC:  Recent Labs  07/13/17 0610 07/16/17 0720 07/20/17 1101 07/25/17 0647  WBC 4.0 5.8 5.5 4.7  HGB 8.4* 8.2* 8.2* 8.0*  HCT 26.3* 25.1* 25.3* 25.2*  PLT 248 271 304 325    COAGS: No results for input(s): INR, APTT in the last 8760 hours.  BMP:  Recent Labs  07/14/17 0631 07/16/17 0720 07/20/17 1102 07/25/17 0647  NA 131* 131* 130* 133*  K 3.8 4.0 3.6 3.3*  CL 101 101 100* 100*  CO2 GLUCOSE 163* 86 144* 146*  BUN CALCIUM 7.7* 8.0* 8.3* 8.2*  CREATININE <0.30* 0.32* 0.35* 0.31*  GFRNONAA NOT CALCULATED >60 >60 >60  GFRAA NOT CALCULATED >60 >60 >60    LIVER FUNCTION TESTS:  Recent Labs  07/12/17 1620 07/13/17 0610 07/20/17 1102 07/25/17 0647  BILITOT 0.3 0.5  --  0.3  AST 37 64*  --  67*  ALT 38 62  --  80*  ALKPHOS 214* 249*  --  350*  PROT 5.7* 5.7*  --  6.2*  ALBUMIN 1.1* 1.0* 1.4* 1.4*    Assessment and Plan:  Abdominal abscesses Draining well Still good OP will follow Will need Re CT when OP is less than 10-15 cc daily for each  Electronically  Signed: Lenville Hibberd A, PA-C 07/26/2017, 9:07 AM   I spent a total of 15 Minutes at the the patient's bedside AND on the patient's hospital floor or unit, greater than 50% of which was counseling/coordinating care for abd abscesses

## 2017-07-27 LAB — POTASSIUM: Potassium: 3.7 mmol/L (ref 3.5–5.1)

## 2017-07-28 LAB — COMPREHENSIVE METABOLIC PANEL
ALBUMIN: 1.7 g/dL — AB (ref 3.5–5.0)
ALT: 70 U/L — ABNORMAL HIGH (ref 17–63)
ANION GAP: 4 — AB (ref 5–15)
AST: 45 U/L — AB (ref 15–41)
Alkaline Phosphatase: 391 U/L — ABNORMAL HIGH (ref 38–126)
BILIRUBIN TOTAL: 0.7 mg/dL (ref 0.3–1.2)
BUN: 7 mg/dL (ref 6–20)
CHLORIDE: 99 mmol/L — AB (ref 101–111)
CO2: 29 mmol/L (ref 22–32)
Calcium: 8.4 mg/dL — ABNORMAL LOW (ref 8.9–10.3)
Creatinine, Ser: 0.37 mg/dL — ABNORMAL LOW (ref 0.61–1.24)
GFR calc Af Amer: >60 mL/min (ref >60)
GFR calc non Af Amer: >60 mL/min (ref >60)
GLUCOSE: 93 mg/dL (ref 65–99)
POTASSIUM: 3.8 mmol/L (ref 3.5–5.1)
Sodium: 132 mmol/L — ABNORMAL LOW (ref 135–145)
TOTAL PROTEIN: 6.7 g/dL (ref 6.5–8.1)

## 2017-07-30 ENCOUNTER — Other Ambulatory Visit (HOSPITAL_COMMUNITY): Payer: Self-pay

## 2017-07-30 LAB — BASIC METABOLIC PANEL
Anion gap: 5 (ref 5–15)
BUN: 10 mg/dL (ref 6–20)
CHLORIDE: 98 mmol/L — AB (ref 101–111)
CO2: 28 mmol/L (ref 22–32)
CREATININE: 0.35 mg/dL — AB (ref 0.61–1.24)
Calcium: 8.3 mg/dL — ABNORMAL LOW (ref 8.9–10.3)
GFR calc non Af Amer: >60 mL/min (ref >60)
Glucose, Bld: 176 mg/dL — ABNORMAL HIGH (ref 65–99)
Potassium: 3.3 mmol/L — ABNORMAL LOW (ref 3.5–5.1)
Sodium: 131 mmol/L — ABNORMAL LOW (ref 135–145)

## 2017-07-30 LAB — PHOSPHORUS: Phosphorus: 3.9 mg/dL (ref 2.5–4.6)

## 2017-07-30 MED ORDER — IOPAMIDOL (ISOVUE-300) INJECTION 61%
INTRAVENOUS | Status: AC
  Filled 2017-07-30: qty 100

## 2017-07-30 MED ORDER — IOPAMIDOL (ISOVUE-300) INJECTION 61%
INTRAVENOUS | Status: AC
  Administered 2017-07-30: 100 mL
  Filled 2017-07-30: qty 30

## 2017-07-31 LAB — BASIC METABOLIC PANEL
ANION GAP: 5 (ref 5–15)
BUN: 11 mg/dL (ref 6–20)
CO2: 27 mmol/L (ref 22–32)
Calcium: 8.4 mg/dL — ABNORMAL LOW (ref 8.9–10.3)
Chloride: 99 mmol/L — ABNORMAL LOW (ref 101–111)
Creatinine, Ser: 0.32 mg/dL — ABNORMAL LOW (ref 0.61–1.24)
GFR calc Af Amer: >60 mL/min (ref >60)
GFR calc non Af Amer: >60 mL/min (ref >60)
GLUCOSE: 138 mg/dL — AB (ref 65–99)
POTASSIUM: 4.2 mmol/L (ref 3.5–5.1)
Sodium: 131 mmol/L — ABNORMAL LOW (ref 135–145)

## 2017-07-31 LAB — CBC
HEMATOCRIT: 27.4 % — AB (ref 39.0–52.0)
Hemoglobin: 8.6 g/dL — ABNORMAL LOW (ref 13.0–17.0)
MCH: 29.3 pg (ref 26.0–34.0)
MCHC: 31.4 g/dL (ref 30.0–36.0)
MCV: 93.2 fL (ref 78.0–100.0)
Platelets: 262 10*3/uL (ref 150–400)
RBC: 2.94 MIL/uL — AB (ref 4.22–5.81)
RDW: 18.9 % — ABNORMAL HIGH (ref 11.5–15.5)
WBC: 5.8 10*3/uL (ref 4.0–10.5)

## 2017-07-31 LAB — MAGNESIUM: Magnesium: 1.7 mg/dL (ref 1.7–2.4)

## 2017-07-31 LAB — PHOSPHORUS: Phosphorus: 3.2 mg/dL (ref 2.5–4.6)

## 2017-08-01 LAB — PHOSPHORUS: Phosphorus: 3 mg/dL (ref 2.5–4.6)

## 2017-08-01 LAB — COMPREHENSIVE METABOLIC PANEL
ALBUMIN: 1.7 g/dL — AB (ref 3.5–5.0)
ALT: 62 U/L (ref 17–63)
ANION GAP: 5 (ref 5–15)
AST: 47 U/L — ABNORMAL HIGH (ref 15–41)
Alkaline Phosphatase: 367 U/L — ABNORMAL HIGH (ref 38–126)
BUN: 10 mg/dL (ref 6–20)
CO2: 29 mmol/L (ref 22–32)
Calcium: 8.4 mg/dL — ABNORMAL LOW (ref 8.9–10.3)
Chloride: 96 mmol/L — ABNORMAL LOW (ref 101–111)
Creatinine, Ser: 0.42 mg/dL — ABNORMAL LOW (ref 0.61–1.24)
GFR calc Af Amer: >60 mL/min (ref >60)
GFR calc non Af Amer: >60 mL/min (ref >60)
GLUCOSE: 86 mg/dL (ref 65–99)
POTASSIUM: 3.8 mmol/L (ref 3.5–5.1)
SODIUM: 130 mmol/L — AB (ref 135–145)
Total Bilirubin: 0.7 mg/dL (ref 0.3–1.2)
Total Protein: 6.7 g/dL (ref 6.5–8.1)

## 2017-08-01 LAB — TRIGLYCERIDES: TRIGLYCERIDES: 72 mg/dL (ref <150)

## 2017-08-01 LAB — MAGNESIUM: Magnesium: 1.6 mg/dL — ABNORMAL LOW (ref 1.7–2.4)

## 2017-08-02 ENCOUNTER — Encounter (HOSPITAL_COMMUNITY): Payer: Self-pay | Admitting: Interventional Radiology

## 2017-08-02 ENCOUNTER — Other Ambulatory Visit (HOSPITAL_COMMUNITY): Payer: Self-pay

## 2017-08-02 HISTORY — PX: IR CATHETER TUBE CHANGE: IMG717

## 2017-08-02 MED ORDER — IOPAMIDOL (ISOVUE-300) INJECTION 61%
INTRAVENOUS | Status: AC
  Administered 2017-08-02: 5 mL
  Filled 2017-08-02: qty 50

## 2017-08-02 MED ORDER — LIDOCAINE HCL 1 % IJ SOLN
INTRAMUSCULAR | Status: AC
  Filled 2017-08-02: qty 20

## 2017-08-02 NOTE — Procedures (Signed)
Damaged abscess drain  S/p fluoro exchg of the right inferior 14 fr RP abscess drain  No comp Stable Keep to suction bulb ebl 0 Full report in pacs

## 2017-08-02 NOTE — Progress Notes (Signed)
Referring Physician(s): Dr. Carron Curie  Supervising Physician: Jolaine Click  Patient Status: [facility]  Chief Complaint: Intra-abdominal fluid collections  Subjective: Patient's wife states that when one of his drains has been flushed the tubing disconnected from the hub of the drain.  The night shift RN got it reattached, but wants this addressed prior to being discharged tomorrow or Wednesday.  The patient denies any new complaints.  Allergies: Patient has no allergy information on record.  Medications: Prior to Admission medications   Not on File    Vital Signs: There were no vitals taken for this visit.  Physical Exam: Abd:  Soft, both drains in place with enteric/bilious contents draining.  Both drains are working well and flush well.   Imaging: Ct Abdomen Pelvis W Contrast  Result Date: 07/31/2017 CLINICAL DATA:  History of exploratory laparotomy in early September, [facility] with existing percutaneous drainage catheters placed at an outside institution. Patient underwent fluoroscopic guided exchange and repositioning of both percutaneous drainage catheters on 07/22/2017 by Dr. Fredia Sorrow. EXAM: CT ABDOMEN AND PELVIS WITH CONTRAST TECHNIQUE: Multidetector CT imaging of the abdomen and pelvis was performed using the standard protocol following bolus administration of intravenous contrast. CONTRAST:  ISOVUE-300 IOPAMIDOL (ISOVUE-300) INJECTION 61% COMPARISON:  CT abdomen pelvis - 07/21/2017; fluoroscopic guided percutaneous drainage catheter exchange and repositioning FINDINGS: Lower chest: Limited visualization of the lower thorax demonstrates grossly unchanged small bilateral pleural effusions with associated bibasilar consolidative opacities, right greater than left. No new focal airspace opacities. Normal heart size. Post median sternotomy. Coronary artery calcifications. No pericardial effusion. Hepatobiliary: Normal hepatic contour. No  discrete hepatic lesions. No intra extrahepatic bili duct dilatation. No ascites. There is a minimal amount of periportal edema, unchanged. No ascites. Pancreas: The pancreas remains atrophic. Spleen: Unchanged appearance of the spleen Adrenals/Urinary Tract: There is symmetric enhancement and excretion of the bilateral kidneys. Re- demonstrated right-sided pelvicaliectasis. Re- demonstrated multiple punctate (sub 3 mm) left-sided renal stones. No left-sided urinary obstruction. Suspected layering stones within urinary bladder. There is mild diffuse thickening the urinary bladder, potentially accentuated due to underdistention. Normal appearance of the bilateral adrenal glands. Stomach/Bowel: Extensive bowel surgery including suspected right hemicolectomy and creation of a loop colostomy within the left upper abdominal quadrant (image 32, series 3). 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 Unchanged patulous distension of ascending portion of the duodenum (image 33, series 3). Re- demonstrated dilatation of several loops of mid small bowel without evidence of enteric obstruction. No pneumoperitoneum, pneumatosis or portal venous gas. Vascular/Lymphatic: There is a large amount of mixed calcified and noncalcified atherosclerotic plaque throughout the abdominal aorta, not resulting in hemodynamically significant stenosis. Suspected hemodynamically significant narrowing involving the origin of the SMA, incompletely evaluated on this non CTA examination. Re- demonstrated ectasia of the mid aspect of the abdominal aorta measuring 2.6 cm in diameter. No bulky retroperitoneal, mesenteric, pelvic or inguinal adenopathy. Reproductive: Dystrophic calcifications within a normal sized prostate gland. The small amount of fluid in the pelvic cul-de-sac. Other: Unchanged positioning of percutaneous drainage catheters with subsequent  reduction in size of residual right-sided retroperitoneal abscess ease. There has been apparent resolution of the residual abscess within the cranial aspect of the right retroperitoneal space with residual surrounding inflammatory/ phlegmonous fluid. Interval reduction in residual caudal right-sided retroperitoneal fluid collection, currently measuring 6.2 x 2.9 cm (image 49, series 3), previously, 7.6 x 6.5 cm, however a large a  amounts of phlegmonous tissue again courses along the right iliopsoas and iliacus musculature. No new definable/drainable fluid collections. Musculoskeletal: Re- demonstrated right L3 transverse process fracture. Post left total hip replacement without evidence of hardware failure or loosening. Mild degenerative change of the right hip. Note is made of a small right-sided os acetabula. IMPRESSION: 1. Interval reduction in right-sided retroperitoneal abscesses following fluoroscopic guided percutaneous drainage catheter exchange and repositioning. 2. Re- demonstrated large amount of residual right-sided retroperitoneal inflammatory and phlegmonous tissue extending to involve the right iliopsoas and iliacus musculature without new definable / drainable fluid collection. 3. Re- demonstrated right L3 transverse process fracture - clinical correlation is advised. In the absence of trauma, superimposed infection/osteomyelitis involving the tip of the tip of the right L3 transverse process is not excluded. 4. 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 5. Re- demonstrated dilatation of several loops of small bowel without definitive evidence of enteric obstruction. 6. Aortic Atherosclerosis (ICD10-I70.0). 7. Mild ectasia of the abdominal aorta measuring 2.6 cm in diameter. Recommend follow-up aortic ultrasound in 5 years. This recommendation follows ACR consensus guidelines: White Paper of the ACR Incidental Findings Committee II on  Vascular Findings. J Am Coll Radiol 2013; 10:789-794. 8. Grossly unchanged mild right-sided pelvicaliectasis, the etiology of which is not depicted on this examination though presumably secondary to the right sided retroperitoneal inflammatory/infectious process. 9. Re- demonstrated left-sided nephrolithiasis and urinary bladder calculi. 10. Unchanged small bilateral effusions and associated bibasilar opacities, atelectasis versus infiltrate. Electronically Signed   By: Simonne Come M.D.   On: 07/31/2017 08:40    Labs:  CBC:  Recent Labs  07/16/17 0720 07/20/17 1101 07/25/17 0647 07/31/17 1058  WBC 5.8 5.5 4.7 5.8  HGB 8.2* 8.2* 8.0* 8.6*  HCT 25.1* 25.3* 25.2* 27.4*  PLT 271 304 325 262    COAGS: No results for input(s): INR, APTT in the last 8760 hours.  BMP:  Recent Labs  07/28/17 1315 07/30/17 0624 07/31/17 1058 08/01/17 1421  NA 132* 131* 131* 130*  K 3.8 3.3* 4.2 3.8  CL 99* 98* 99* 96*  CO2 GLUCOSE 93 176* 138* 86  BUN CALCIUM 8.4* 8.3* 8.4* 8.4*  CREATININE 0.37* 0.35* 0.32* 0.42*  GFRNONAA >60 >60 >60 >60  GFRAA >60 >60 >60 >60    LIVER FUNCTION TESTS:  Recent Labs  07/13/17 0610 07/20/17 1102 07/25/17 0647 07/28/17 1315 08/01/17 1421  BILITOT 0.5  --  0.3 0.7 0.7  AST 64*  --  67* 45* 47*  ALT 62  --  80* 70* 62  ALKPHOS 249*  --  350* 391* 367*  PROT 5.7*  --  6.2* 6.7 6.7  ALBUMIN 1.0* 1.4* 1.4* 1.7* 1.7*    Assessment and Plan: 1. Intra-abdominal abscesses, s/p perc drain at outside facility and exchanges done by IR here.  Both drains are currently working well; however, one drain has come apart on several occasions prior to today, but no one has told IR about it.  No one currently can tell me which drain is having the problem.  When the night RN arrives tonight, I have instructed them to have her write a progress note to let us know which drain is the defective one, so we know which drain to exchange.  We will plan to  exchange that drain prior to his discharge.  The patient's output is also no longer purulent, but enteric.  Unclear when  that changed as I have not seen him in several weeks.  This would be consistent with further anastomotic leak/fistula.  Electronically Signed: Letha Cape 08/02/2017, 8:19 AM   I spent a total of 15 Minutes at the the patient's bedside AND on the patient's hospital floor or unit, greater than 50% of which was counseling/coordinating care for intra-abdominal fluid collections

## 2017-08-03 LAB — CBC
HCT: 27.7 % — ABNORMAL LOW (ref 39.0–52.0)
Hemoglobin: 8.9 g/dL — ABNORMAL LOW (ref 13.0–17.0)
MCH: 30.2 pg (ref 26.0–34.0)
MCHC: 32.1 g/dL (ref 30.0–36.0)
MCV: 93.9 fL (ref 78.0–100.0)
PLATELETS: 247 10*3/uL (ref 150–400)
RBC: 2.95 MIL/uL — AB (ref 4.22–5.81)
RDW: 18.3 % — ABNORMAL HIGH (ref 11.5–15.5)
WBC: 5.9 10*3/uL (ref 4.0–10.5)

## 2017-08-03 LAB — COMPREHENSIVE METABOLIC PANEL
ALT: 36 U/L (ref 17–63)
ANION GAP: 6 (ref 5–15)
AST: 26 U/L (ref 15–41)
Albumin: 1.6 g/dL — ABNORMAL LOW (ref 3.5–5.0)
Alkaline Phosphatase: 323 U/L — ABNORMAL HIGH (ref 38–126)
BUN: 10 mg/dL (ref 6–20)
CHLORIDE: 96 mmol/L — AB (ref 101–111)
CO2: 29 mmol/L (ref 22–32)
Calcium: 8.5 mg/dL — ABNORMAL LOW (ref 8.9–10.3)
Creatinine, Ser: 0.39 mg/dL — ABNORMAL LOW (ref 0.61–1.24)
GFR calc non Af Amer: >60 mL/min (ref >60)
Glucose, Bld: 136 mg/dL — ABNORMAL HIGH (ref 65–99)
POTASSIUM: 3.6 mmol/L (ref 3.5–5.1)
SODIUM: 131 mmol/L — AB (ref 135–145)
Total Bilirubin: 0.6 mg/dL (ref 0.3–1.2)
Total Protein: 6.4 g/dL — ABNORMAL LOW (ref 6.5–8.1)

## 2017-08-03 LAB — PHOSPHORUS: PHOSPHORUS: 3.6 mg/dL (ref 2.5–4.6)

## 2017-08-03 LAB — MAGNESIUM: Magnesium: 1.7 mg/dL (ref 1.7–2.4)

## 2018-01-30 DEATH — deceased

## 2019-07-01 IMAGING — CT CT ABD-PELV W/ CM
2 of 5 series · 15 of 46 positions shown, 17 images · IV contrast (APPLIED)
Comparison: None.

CLINICAL DATA: Upper right abdominal pain for weeks. Nausea and
vomiting. History of exploratory laparotomy early [REDACTED]. No
clinical improvement.

EXAM:
CT ABDOMEN AND PELVIS WITH CONTRAST
TECHNIQUE: Multidetector CT imaging of the abdomen and pelvis was performed
using the standard protocol following bolus administration of
intravenous contrast.
CONTRAST:  100mL FIWAWP-AQQ IOPAMIDOL (FIWAWP-AQQ) INJECTION 61%

[Series 3: abdomen 5.0 · axial · 0.74mm/px · z∈[+794,+1214]mm · 12 of 96 slices shown, 14 images]
[im 6/96  soft-tissue]
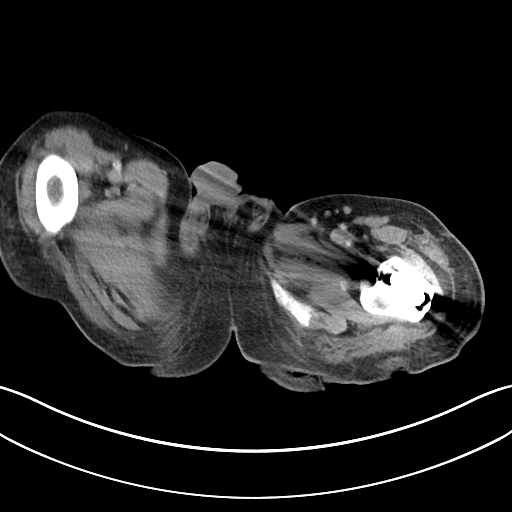
[im 6/96  bone]
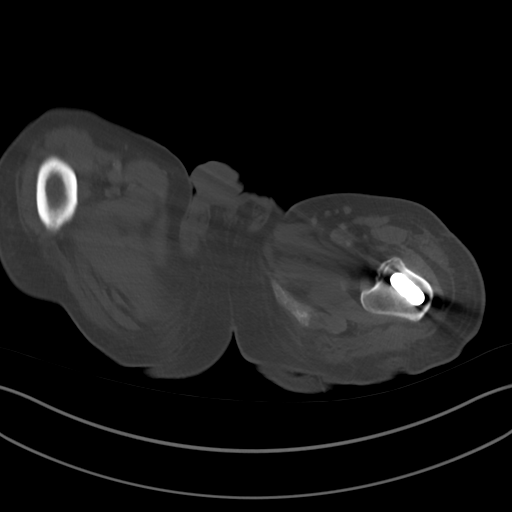
[im 17/96  soft-tissue]
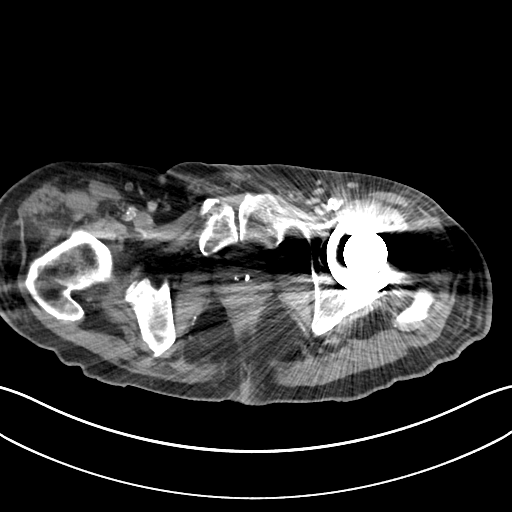
[im 23/96  soft-tissue]
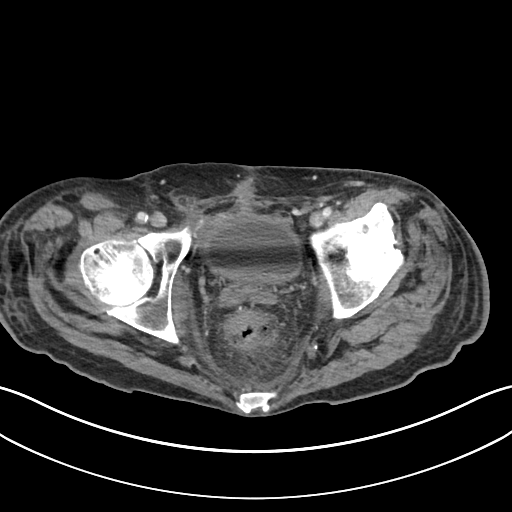
[im 28/96  soft-tissue]
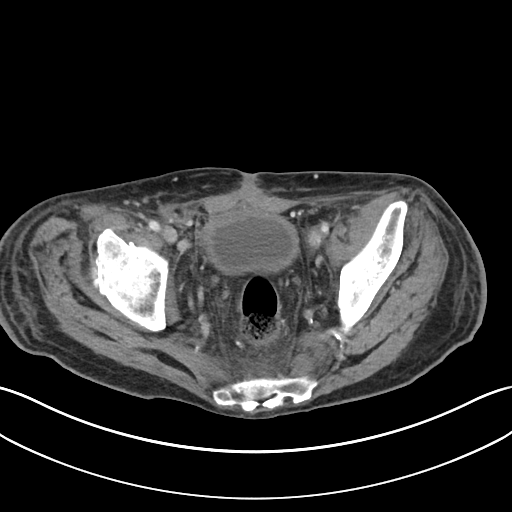
[im 40/96  soft-tissue]
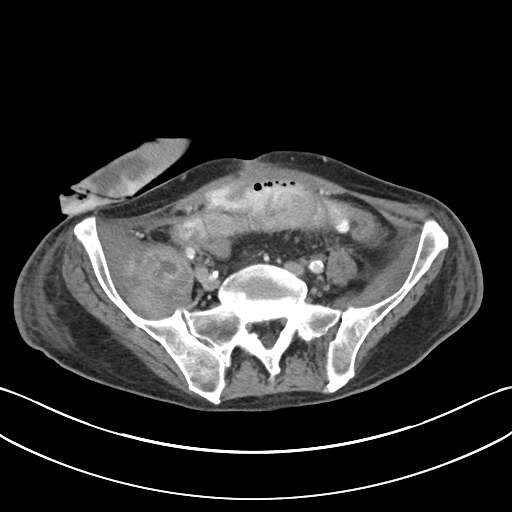
[im 45/96  soft-tissue]
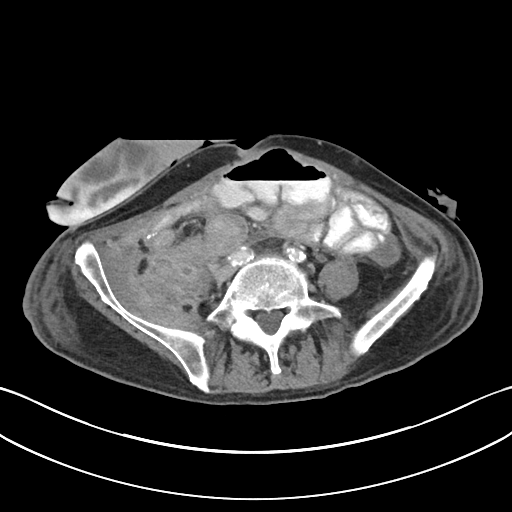
[im 51/96  soft-tissue]
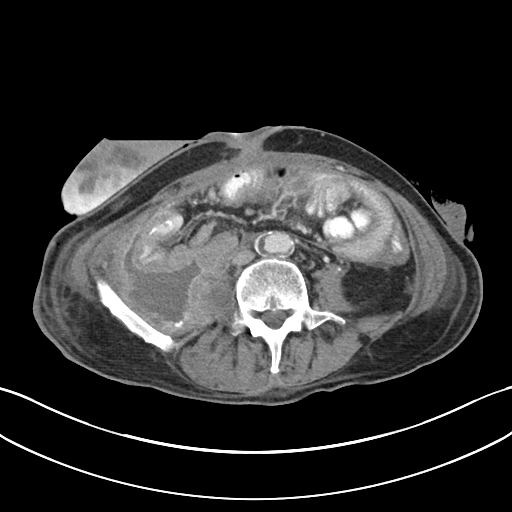
[im 62/96  soft-tissue]
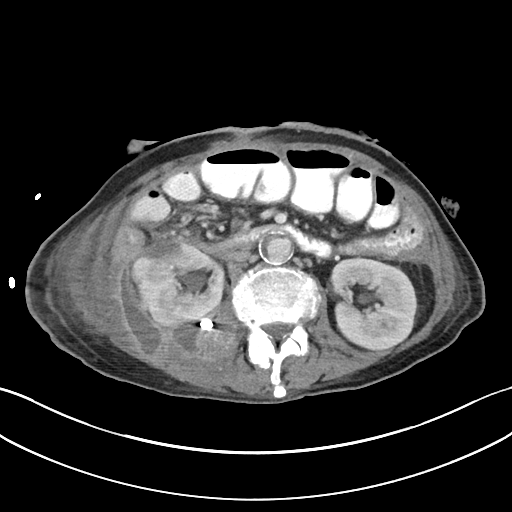
[im 68/96  soft-tissue]
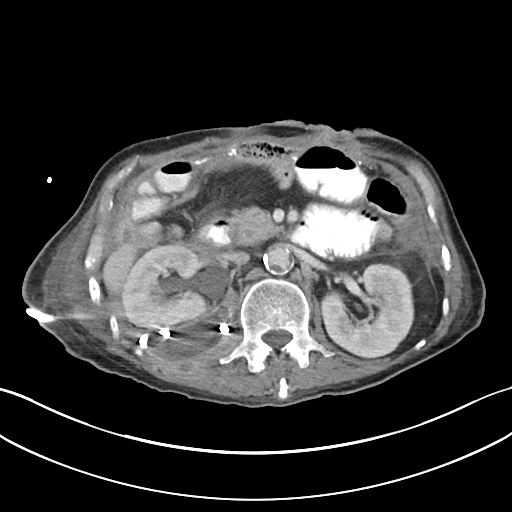
[im 68/96  bone]
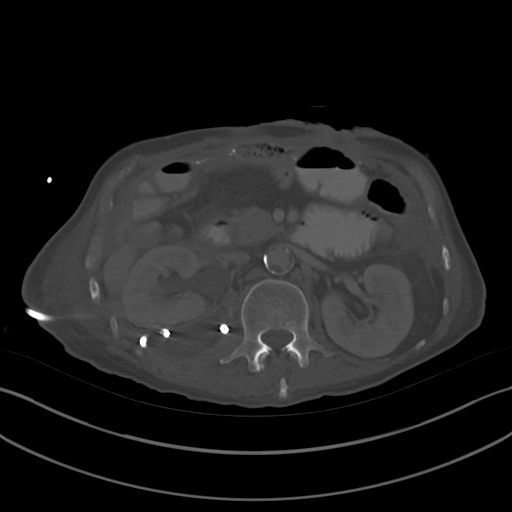
[im 73/96  soft-tissue]
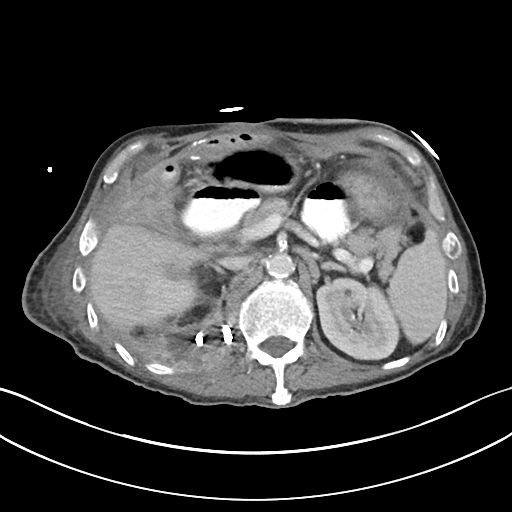
[im 84/96  soft-tissue]
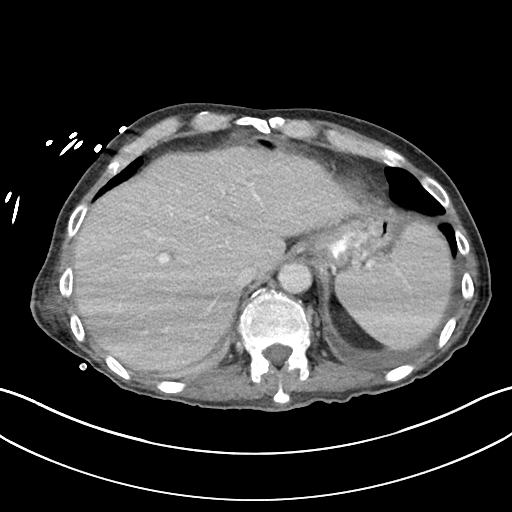
[im 90/96  soft-tissue]
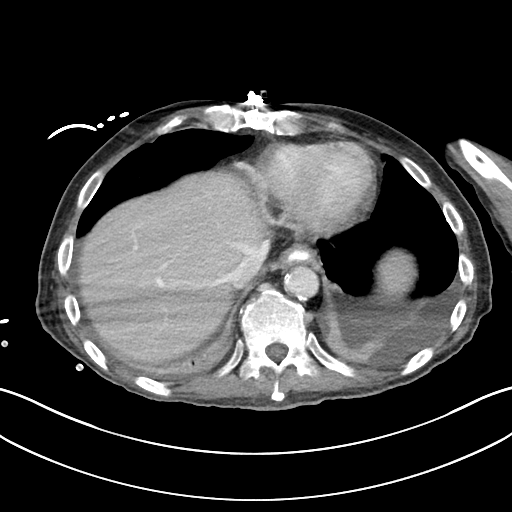

[Series 6: abdomen 3.0 mpr cor · coronal · 0.72mm/px · 3 of 82 slices shown]
[im 28/82  soft-tissue]
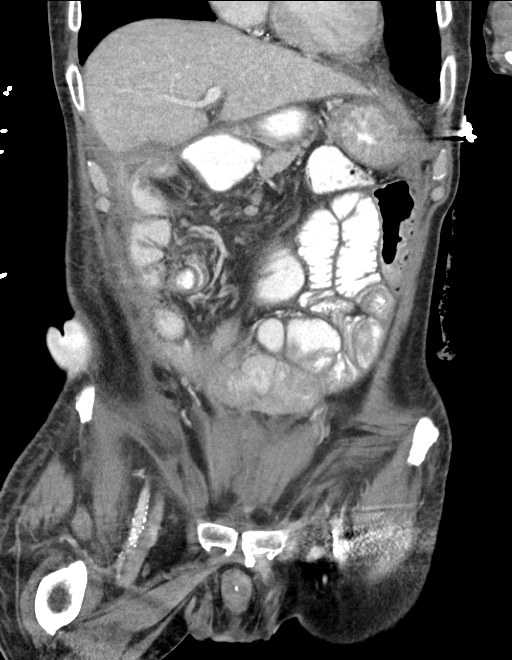
[im 37/82  soft-tissue]
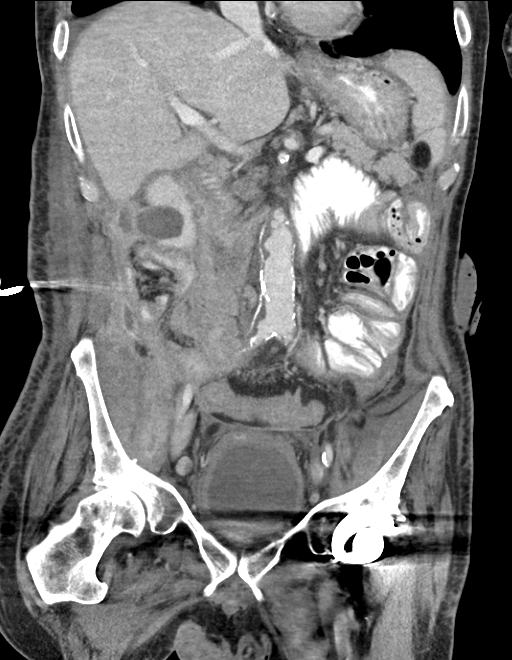
[im 46/82  soft-tissue]
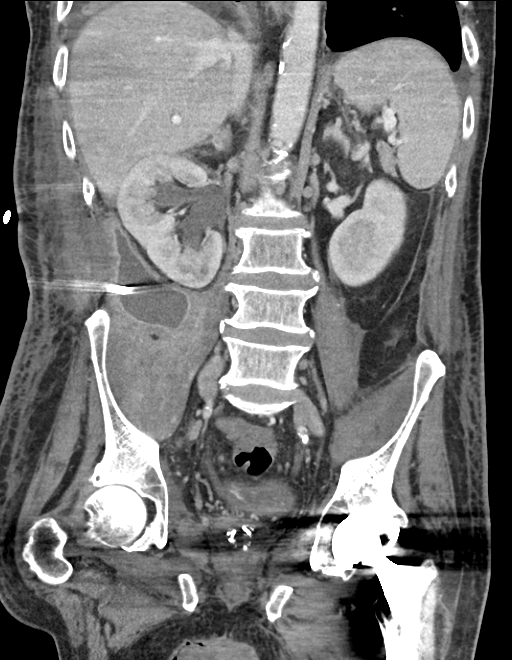

[15 of 46 positions shown; findings below may reference images not displayed]

FINDINGS: Lower chest: Small bilateral pleural effusions with atelectasis.
Coronary atherosclerosis. Gastroesophageal reflux.

Hepatobiliary: No focal liver abnormality.Collapsed gallbladder may
be present. No calcified stone or ductal dilatation.

Pancreas: Unremarkable.

Spleen: Rounded fatty density along the anterior capsule measuring
19 mm, simple appearing

Adrenals/Urinary Tract: Negative adrenals. Right posterior pararenal
rim enhancing fluid collection measuring 7 x 3 x 11 cm. There is
tracking inferiorly into the right iliopsoas, to the level of the
groin. Two percutaneous catheters are in place and and broad
continuity the collection. Mild right hydronephrosis. No
hydroureter. Cystic density in the ventral right kidney measuring 3
cm, with imperceptible walls suggesting cyst rather than abscess.
There are small bilateral renal calculi measuring 3 mm or less.

Stomach/Bowel: Extensive bowel surgery. Evidence of a right
hemicolectomy and there is also enteroenteric anastomosis in the
right lower quadrant. There appears to be a distal transverse double
lumen colostomy. The associated bag does not contain contrast. There
also appears to be a midline bag which does contain oral contrast,
presumably covering a enterocutaneous fistula. There are some
dilated loops of small bowel without high-grade obstruction. Colon
is decompressed.

Vascular/Lymphatic: Advanced atherosclerosis of the aorta and branch
vessels. Distal aorta is 25 mm diameter.

Reproductive:No acute finding.

Other: No ascites or pneumoperitoneum.

Musculoskeletal: L3 right transverse process fracture. Left hip
arthroplasty. No evidence of osseous infection.
IMPRESSION: 1. Large right posterior pararenal collection/abscess, 7 x 3 x 11
cm, despite 2 drains within the collection. Please correlate for
catheter output. The collection tracks [place] the inflamed right
iliopsoas to the level of the inguinal ligament. Primary source of
this collection is uncertain, it could reflect a complicated
hematoma or primary abscess. There is postoperative changes or
scarring to the upper pole right kidney.
2. Postoperative bowel. Oral contrast does not reach a distal
transverse colostomy and is likely preferentially draining through
an enterocutaneous fistula in the midline. Negative for bowel
obstruction. Oral contrast does not fill the retroperitoneal
collection
3. Thick walled bladder with nonspecific calcific density near the
right ureteral orifice. Small volume pneumaturia. Please correlate
with urinalysis.
4. Mild right hydronephrosis.
5. Small bilateral pleural effusions multi segment atelectasis.
6. L3 right transverse process fracture.
7. Advanced atherosclerosis.
8. Bilateral nephrolithiasis.
